# Patient Record
Sex: Male | Born: 1990 | Race: Black or African American | Hispanic: No | Marital: Single | Smoking: Never smoker
Health system: Southern US, Community
[De-identification: ages and names within clinical notes are randomized; demographics above are authoritative.]

---
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
## 2012-05-25 ENCOUNTER — Emergency Department (HOSPITAL_COMMUNITY)
Admission: EM | Admit: 2012-05-25 | Discharge: 2012-05-25 | Disposition: A | Discharge status: Home / Self Care | Payer: 59 | Source: Home / Self Care

## 2012-05-25 ENCOUNTER — Encounter (HOSPITAL_COMMUNITY): Payer: Self-pay | Admitting: Emergency Medicine

## 2012-05-25 DIAGNOSIS — A084 Viral intestinal infection, unspecified: Secondary | ICD-10-CM

## 2012-05-25 DIAGNOSIS — A088 Other specified intestinal infections: Secondary | ICD-10-CM

## 2012-05-25 MED ORDER — ONDANSETRON HCL 4 MG/2ML IJ SOLN
INTRAMUSCULAR | Status: AC
  Filled 2012-05-25: qty 2

## 2012-05-25 MED ORDER — ONDANSETRON HCL 4 MG/2ML IJ SOLN
4.0000 mg | Freq: Once | INTRAMUSCULAR | Status: AC
  Administered 2012-05-25: 4 mg via INTRAMUSCULAR

## 2012-05-25 MED ORDER — ONDANSETRON HCL 4 MG PO TABS: 4.0000 mg | ORAL_TABLET | Freq: Four times a day (QID) | ORAL | Status: DC

## 2012-05-25 MED ORDER — HYOSCYAMINE SULFATE 0.125 MG SL SUBL: 0.1250 mg | SUBLINGUAL_TABLET | SUBLINGUAL | Status: DC | PRN

## 2012-05-25 NOTE — ED Provider Notes (Signed)
History     CSN: [id number]  Arrival date & time 05/25/12  1030   None     Chief Complaint  Patient presents with  . URI    (Consider location/radiation/quality/duration/timing/severity/associated sxs/prior treatment) HPI Comments: 22 year old male with a 24-36 hour history of fever, bodyaches and abdominal pain. It is associated with nausea, vomiting and diarrhea. She has also had some dizziness quickly when leaning forward. He denies any upper respiratory symptoms. No earache, cough, congestion, or runny nose. He points to the perineal umbilical area the abdomen as a source for his abdominal cramping.   History reviewed. No pertinent past medical history.  History reviewed. No pertinent past surgical history.  No family history on file.  History  Substance Use Topics  . Smoking status: Never Smoker   . Smokeless tobacco: Not on file  . Alcohol Use: Yes      Review of Systems  Constitutional: Positive for fever and activity change. Negative for diaphoresis and fatigue.  HENT: Positive for sore throat and trouble swallowing. Negative for ear pain, facial swelling, rhinorrhea, neck pain, neck stiffness and postnasal drip.   Eyes: Negative for pain, discharge and redness.  Respiratory: Negative for cough, chest tightness and shortness of breath.   Cardiovascular: Negative.   Gastrointestinal: Negative.   Genitourinary: Negative.   Musculoskeletal: Negative.   Skin: Negative.   Neurological: Positive for dizziness.  Psychiatric/Behavioral: Negative.     Allergies  Review of patient's allergies indicates no known allergies.  Home Medications   Current Outpatient Rx  Name  Route  Sig  Dispense  Refill  . HYOSCYAMINE SULFATE 0.125 MG SL SUBL   Sublingual   Place 1 tablet (0.125 mg total) under the tongue every 4 (four) hours as needed for cramping.   20 tablet   0   . ONDANSETRON HCL 4 MG PO TABS   Oral   Take 1 tablet (4 mg total) by mouth every 6 (six)  hours.   12 tablet   0     BP 121/74  Pulse 82  Temp 99 F (37.2 C) (Oral)  Resp 16  SpO2 100%  Physical Exam  Nursing note and vitals reviewed. Constitutional: He is oriented to person, place, and time. He appears well-developed and well-nourished. No distress.  HENT:  Nose: Nose normal.  Mouth/Throat: Oropharynx is clear and moist. No oropharyngeal exudate.  Eyes: Conjunctivae normal and EOM are normal.  Neck: Normal range of motion. Neck supple.  Cardiovascular: Normal rate, regular rhythm and normal heart sounds.   Pulmonary/Chest: Effort normal and breath sounds normal. No respiratory distress. He has no wheezes. He has no rales.  Abdominal: Soft. Bowel sounds are normal. He exhibits no distension and no mass. There is no rebound and no guarding.       Minor periumbilical tenderness only. No rebound or guarding. No palpable masses the  Musculoskeletal: Normal range of motion. He exhibits no edema.  Lymphadenopathy:    He has no cervical adenopathy.  Neurological: He is alert and oriented to person, place, and time.  Skin: Skin is warm and dry. No rash noted.  Psychiatric: He has a normal mood and affect.    ED Course  Procedures (including critical care time)  Labs Reviewed - No data to display No results found.   1. Viral gastroenteritis       MDM  Instructions for viral gastroenteritis. Clear liquids in small frequent amounts for the next 3 days. Gradually advance diet as tolerated. Rest fluids  would be Pedialyte and Gatorade. Zofran 4 mg IM now Zofran 4 mg tablets Q4 to 6 hours when necessary nausea and vomiting Levsin .125 mg tablets sublingual every 4 hours when necessary cramping. Is having greater then 3 diarrhea stools per day may take Imodium right ear but only take enough medicine to slow the diarrhea down. Do not stop the diarrhea as this is the way the body shed the virus. For any worsening, higher fever or unable to hold down liquids or if your  urine volume decreases and getting sicker return or go to emergency department.           Hayden Rasmussen, NP 05/25/12 1257

## 2012-05-25 NOTE — ED Provider Notes (Signed)
Medical screening examination/treatment/procedure(s) were performed by non-physician practitioner and as supervising physician I was immediately available for consultation/collaboration.  Kratos Ruscitti, M.D.   Valrie Jia C Emori Mumme, MD 05/25/12 1703 

## 2012-05-25 NOTE — ED Notes (Signed)
Pt c/o cold sx since Saturday night.  Sx incude: fever of 101 yest, body aches, abd pain, diarrhea, vomiting, light headed Unable to keep food and fluids down  He is alert w/no signs of acute distress.

## 2013-05-04 ENCOUNTER — Inpatient Hospital Stay (HOSPITAL_COMMUNITY)
Admission: AD | Admit: 2013-05-04 | Discharge: 2013-05-08 | DRG: 885 | Disposition: A | Discharge status: Home / Self Care | Payer: 59 | Source: Intra-hospital | Attending: Psychiatry | Admitting: Psychiatry

## 2013-05-04 ENCOUNTER — Emergency Department (HOSPITAL_COMMUNITY)
Admission: EM | Admit: 2013-05-04 | Discharge: 2013-05-04 | Disposition: A | Discharge status: Other Acute Inpatient Hospital | Payer: 59 | Attending: Emergency Medicine | Admitting: Emergency Medicine

## 2013-05-04 ENCOUNTER — Encounter (HOSPITAL_COMMUNITY): Payer: Self-pay | Admitting: Emergency Medicine

## 2013-05-04 ENCOUNTER — Encounter (HOSPITAL_COMMUNITY): Payer: Self-pay | Admitting: *Deleted

## 2013-05-04 DIAGNOSIS — F329 Major depressive disorder, single episode, unspecified: Secondary | ICD-10-CM

## 2013-05-04 DIAGNOSIS — F332 Major depressive disorder, recurrent severe without psychotic features: Principal | ICD-10-CM | POA: Diagnosis present

## 2013-05-04 DIAGNOSIS — R45851 Suicidal ideations: Secondary | ICD-10-CM

## 2013-05-04 DIAGNOSIS — F911 Conduct disorder, childhood-onset type: Secondary | ICD-10-CM | POA: Insufficient documentation

## 2013-05-04 DIAGNOSIS — F4323 Adjustment disorder with mixed anxiety and depressed mood: Secondary | ICD-10-CM | POA: Diagnosis present

## 2013-05-04 DIAGNOSIS — R4689 Other symptoms and signs involving appearance and behavior: Secondary | ICD-10-CM

## 2013-05-04 DIAGNOSIS — F1010 Alcohol abuse, uncomplicated: Secondary | ICD-10-CM | POA: Diagnosis present

## 2013-05-04 DIAGNOSIS — Z79899 Other long term (current) drug therapy: Secondary | ICD-10-CM

## 2013-05-04 DIAGNOSIS — F3289 Other specified depressive episodes: Secondary | ICD-10-CM | POA: Insufficient documentation

## 2013-05-04 LAB — RAPID URINE DRUG SCREEN, HOSP PERFORMED
Barbiturates: NOT DETECTED
Cocaine: NOT DETECTED
Opiates: NOT DETECTED

## 2013-05-04 LAB — COMPREHENSIVE METABOLIC PANEL
Alkaline Phosphatase: 59 U/L (ref 39–117)
BUN: 16 mg/dL (ref 6–23)
Calcium: 9.5 mg/dL (ref 8.4–10.5)
GFR calc Af Amer: >90 mL/min (ref >90)
GFR calc non Af Amer: >90 mL/min (ref >90)
Glucose, Bld: 72 mg/dL (ref 70–99)
Total Protein: 7.3 g/dL (ref 6.0–8.3)

## 2013-05-04 LAB — ETHANOL: Alcohol, Ethyl (B): <11 mg/dL (ref 0–11)

## 2013-05-04 LAB — CBC
HCT: 43.1 % (ref 39.0–52.0)
Hemoglobin: 14 g/dL (ref 13.0–17.0)
MCH: 25.2 pg — ABNORMAL LOW (ref 26.0–34.0)
MCHC: 32.5 g/dL (ref 30.0–36.0)
MCV: 77.7 fL — ABNORMAL LOW (ref 78.0–100.0)

## 2013-05-04 MED ORDER — IBUPROFEN 200 MG PO TABS: 600.0000 mg | ORAL_TABLET | Freq: Three times a day (TID) | ORAL | Status: DC | PRN

## 2013-05-04 MED ORDER — ZOLPIDEM TARTRATE 5 MG PO TABS: 5.0000 mg | ORAL_TABLET | Freq: Every evening | ORAL | Status: DC | PRN

## 2013-05-04 MED ORDER — TRAZODONE HCL 50 MG PO TABS: 50.0000 mg | ORAL_TABLET | Freq: Every evening | ORAL | Status: DC | PRN

## 2013-05-04 MED ORDER — HYDROXYZINE HCL 25 MG PO TABS: 25.0000 mg | ORAL_TABLET | Freq: Four times a day (QID) | ORAL | Status: DC | PRN

## 2013-05-04 MED ORDER — FLUOXETINE HCL 20 MG PO CAPS
20.0000 mg | ORAL_CAPSULE | Freq: Every day | ORAL | Status: DC
  Administered 2013-05-05 – 2013-05-08 (×4): 20 mg via ORAL
  Filled 2013-05-04 (×7): qty 1

## 2013-05-04 MED ORDER — ONDANSETRON HCL 4 MG PO TABS: 4.0000 mg | ORAL_TABLET | Freq: Three times a day (TID) | ORAL | Status: DC | PRN

## 2013-05-04 MED ORDER — NICOTINE 21 MG/24HR TD PT24: 21.0000 mg | MEDICATED_PATCH | Freq: Every day | TRANSDERMAL | Status: DC

## 2013-05-04 MED ORDER — LORAZEPAM 1 MG PO TABS: 1.0000 mg | ORAL_TABLET | Freq: Three times a day (TID) | ORAL | Status: DC | PRN

## 2013-05-04 MED ORDER — TRAZODONE HCL 50 MG PO TABS
50.0000 mg | ORAL_TABLET | Freq: Every evening | ORAL | Status: DC | PRN
  Filled 2013-05-04: qty 1

## 2013-05-04 MED ORDER — ALUM & MAG HYDROXIDE-SIMETH 200-200-20 MG/5ML PO SUSP: 30.0000 mL | ORAL | Status: DC | PRN

## 2013-05-04 MED ORDER — MAGNESIUM HYDROXIDE 400 MG/5ML PO SUSP: 30.0000 mL | Freq: Every day | ORAL | Status: DC | PRN

## 2013-05-04 MED ORDER — INFLUENZA VAC SPLIT QUAD 0.5 ML IM SUSP
0.5000 mL | INTRAMUSCULAR | Status: AC
  Administered 2013-05-06: 0.5 mL via INTRAMUSCULAR
  Filled 2013-05-04: qty 0.5

## 2013-05-04 MED ORDER — ACETAMINOPHEN 325 MG PO TABS: 650.0000 mg | ORAL_TABLET | ORAL | Status: DC | PRN

## 2013-05-04 MED ORDER — IBUPROFEN 600 MG PO TABS: 600.0000 mg | ORAL_TABLET | Freq: Three times a day (TID) | ORAL | Status: DC | PRN

## 2013-05-04 MED ORDER — NICOTINE 21 MG/24HR TD PT24
21.0000 mg | MEDICATED_PATCH | Freq: Every day | TRANSDERMAL | Status: DC
  Filled 2013-05-04: qty 1

## 2013-05-04 MED ORDER — FLUOXETINE HCL 20 MG PO CAPS
20.0000 mg | ORAL_CAPSULE | Freq: Every day | ORAL | Status: DC
  Administered 2013-05-04: 20 mg via ORAL
  Filled 2013-05-04: qty 1

## 2013-05-04 NOTE — ED Notes (Signed)
Pt. Mom is taking his belongings with her. Nurse was notified

## 2013-05-04 NOTE — ED Notes (Signed)
Pt calm and cooperative at this time.  Pt deneis SI.  Pt reports "i am afraid that if i dont get some help, I may hurt someone."  Pt reports hx anger since toddler that has been increasing.  Pt reports playing football during school that helped some.  Pt reports "getting into uncessisary altercations and fightingand hitting walls when angry to avoid hitting others."  Pt reports "i used to lift weights until i felt the pain and it worked for a little while, I just need something to calm me."
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
## 2013-05-04 NOTE — ED Notes (Signed)
No acute distress noted.

## 2013-05-04 NOTE — Progress Notes (Signed)
   CARE MANAGEMENT ED NOTE 05/04/2013  Patient:  Dustin Chapman, Dustin Chapman   Account Number:  [id number]  Date Initiated:  05/04/2013  Documentation initiated by:  Edd Arbour  Subjective/Objective Assessment:   22 yr old male [facility] without pcp listed Pt confirms he does not have a pcp and reports his mother has his insurance card with his wallet     Subjective/Objective Assessment Detail:     Action/Plan:   Cm spoke with the pt and discussed how to obtain in network providers   Action/Plan Detail:   Anticipated DC Date:       Status Recommendation to Physician:   Result of Recommendation:    Other ED Services  Consult Working Plan    DC Planning Services  Other  PCP issues  Outpatient Services - Pt will follow up    Choice offered to / List presented to:            Status of service:  Completed, signed off  ED Comments:   ED Comments Detail:  [facility] ED CM spoke with pt on how to obtain an in network pcp with insurance coverage via the customer service number or web site Cm reviewed ED level of care for crisis/emergent services and community pcp level of care to manage continuous or chronic medical concerns.  The pt voiced understanding CM encouraged pt and discussed pt's responsibility to verify with pt's insurance carrier that any recommended medical provider offered by any emergency room or a hospital provider is within the carrier's network. The pt voiced understanding

## 2013-05-04 NOTE — ED Notes (Signed)
Pt coming from home with c/o medical clearance. Pt sts he feels very depressed (last 3 months). Pt sts he recently moved out of his mom's house and  is still dealing with living independently and paying bills. Pt sts he is experiencing anger issues and is afraid that he might seriously hurt someone during those episodes. Pt denies suicidal ideations, however reports hitting himself when extremely mad.

## 2013-05-04 NOTE — Progress Notes (Signed)
Writer consulted with the NP Shuvon regarding the patient meeting criteria for inpatient hospitalization. Per Minerva Areola, [facility] the patient has been accepted to [facility] Bed 502-2.   Writer informed the ER MD and the nurse. Support paperwork has been faxed to [facility].
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
## 2013-05-04 NOTE — Tx Team (Signed)
Initial Interdisciplinary Treatment Plan  PATIENT STRENGTHS: (choose at least two) Ability for insight Average or above average intelligence Capable of independent living General fund of knowledge Physical Health Supportive family/friends Work skills  PATIENT STRESSORS: Loss of grandparent/great grandparents/anger   PROBLEM LIST: Problem List/Patient Goals Date to be addressed Date deferred Reason deferred Estimated date of resolution  anger 05/04/13     Self harm behavior 05/04/13                                                DISCHARGE CRITERIA:  Ability to meet basic life and health needs Improved stabilization in mood, thinking, and/or behavior Motivation to continue treatment in a less acute level of care Need for constant or close observation no longer present Reduction of life-threatening or endangering symptoms to within safe limits Safe-care adequate arrangements made Verbal commitment to aftercare and medication compliance  PRELIMINARY DISCHARGE PLAN: Outpatient therapy Return to previous living arrangement  PATIENT/FAMIILY INVOLVEMENT: This treatment plan has been presented to and reviewed with the patient, Dustin Chapman, and/or family member,   The patient and family have been given the opportunity to ask questions and make suggestions.  Beatrix Shipper 05/04/2013, 9:24 PM

## 2013-05-05 ENCOUNTER — Encounter (HOSPITAL_COMMUNITY): Payer: Self-pay | Admitting: Family

## 2013-05-05 NOTE — [facility] Group Notes (Signed)
Adult Psychoeducational Group Note  Date:  05/05/2013 Time:  9:49 PM  Group Topic/Focus:  Wrap-Up Group:   The focus of this group is to help patients review their daily goal of treatment and discuss progress on daily workbooks.  Participation Level:  None  Participation Quality:  Appropriate  Affect:  Appropriate  Cognitive:  Appropriate  Insight: None  Engagement in Group:  None  Modes of Intervention:  Discussion  Additional Comments:  Patient did not participate in group.  Delia Chimes 05/05/2013, 9:49 PM
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
## 2013-05-05 NOTE — Tx Team (Signed)
Interdisciplinary Treatment Plan Update (Adult)  Date: 05/05/2013  Time Reviewed:  9:45 AM  Progress in Treatment: Attending groups: Yes Participating in groups:  Yes Taking medication as prescribed:  Yes Tolerating medication:  Yes Family/Significant othe contact made: CSW assessing  Patient understands diagnosis:  Yes Discussing patient identified problems/goals with staff:  Yes Medical problems stabilized or resolved:  Yes Denies suicidal/homicidal ideation: Yes Issues/concerns per patient self-inventory:  Yes Other:  New problem(s) identified: N/A  Discharge Plan or Barriers: CSW assessing for appropriate referrals.  Reason for Continuation of Hospitalization: Anxiety Depression Medication Stabilization  Comments: N/A  Estimated length of stay: 3-5 days  For review of initial/current patient goals, please see plan of care.  Attendees: Patient:    Family:     Physician:    Nursing:      Clinical Social Worker:  Dustin Ivan, LCSW 05/05/2013 10:32 AM   Other: Nanine Means, NP 05/05/2013 10:32 AM   Other:  Elizbeth Squires, care coordination 05/05/2013 10:32 AM   Other:  Juline Patch, LCSW 05/05/2013 10:32 AM   Other:  Lowella Grip, RN 05/05/2013 10:33 AM   Other:  Burnetta Sabin, RN 05/05/2013 10:33 AM   Other:  Valentina Shaggy, RN 05/05/2013 10:33 AM   Other: Claudette Head, NP 05/05/2013 10:34 AM   Other:    Other:      Scribe for Treatment Team:   Carmina Miller, 05/05/2013 , 10:32 AM

## 2013-05-05 NOTE — Progress Notes (Signed)
Adult Psychoeducational Group Note  Date:  05/05/2013 Time:  11:00am  Group Topic/Focus:  Personal Choices and Values:   The focus of this group is to help patients assess and explore the importance of values in their lives, how their values affect their decisions, how they express their values and what opposes their expression.  Participation Level:  Active  Participation Quality:  Appropriate and Attentive  Affect:  Appropriate  Cognitive:  Alert and Appropriate  Insight: Appropriate  Engagement in Group:  Engaged  Modes of Intervention:  Discussion and Education  Additional Comments:  Pt attended and participated in group. Discussion was on New Year's Resolution and the positive change you want to make in your personal development. 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  Shelly Bombard D 05/05/2013, 1:35 PM
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
## 2013-05-05 NOTE — BHH Group Notes (Signed)
[facility] LCSW Aftercare Discharge Planning Group Note   05/05/2013 8:45 AM  Participation Quality:  Alert, Appropriate and Oriented  Mood/Affect:  Flat and Depressed  Depression Rating:  2  Anxiety Rating:  1  Thoughts of Suicide:  Pt denies SI/HI  Will you contract for safety?   Yes  Current AVH:  Pt denies  Plan for Discharge/Comments:  Pt attended discharge planning group and actively participated in group.  CSW provided pt with today's workbook.  Pt reports coming to the hospital for depression and anger issues.  Pt states that he lives in [place] with his girlfriend and can return home there.  Pt is open to referrals.  CSW will assess for appropriate referrals.  No further needs voiced by pt at this time.    Transportation Means: Pt reports access to transportation - mother will pick pt up  Supports: No supports mentioned at this time  [name], LCSW 05/05/2013 10:23 AM

## 2013-05-05 NOTE — Progress Notes (Signed)
D: Patient denies SI/HI and A/V hallucinations; patient reports sleep is well; reports appetite is good ; reports energy level is normal ; reports ability to pay attention is good; rates depression as 2/10; rates hopelessness 1/10  A: Monitored q 15 minutes; patient encouraged to attend groups; patient educated about medications; patient given medications per physician orders; patient encouraged to express feelings and/or concerns  R: Patient is very quiet and minimal; patient will engage and wants to seek help with his anger; patient is appropriate to circumstances;  patient was able to set goal to talk with staff 1:1 when having feelings of SI; patient is taking medications as prescribed and tolerating medications; patient is attending all groups and engaging
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
## 2013-05-05 NOTE — Clinical Social Work Note (Signed)
Pt requested CSW to contact his employer, [employer], to inform them that pt is in the hospital.  CSW received consent from pt to make contact.  CSW spoke with Sel at [phone] and informed them that pt is in the hospital.   [name], LCSW 05/05/2013  10:47 AM
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
## 2013-05-06 DIAGNOSIS — F332 Major depressive disorder, recurrent severe without psychotic features: Principal | ICD-10-CM

## 2013-05-06 NOTE — Progress Notes (Signed)
Patient ID: Dustin Chapman, male   DOB: 1991-01-04, 23 y.o.   MRN: [id number]  D: Patient presents with depressed mood and affect. Pt. Denies SI/HI and A/V Hallucinations. Patient does not report any pain or discomfort at this time. Patient reports that he slept well and his appetite is good. Patient rates his depression and hopelessness at 1/10 today.  A: Support and encouragement provided to the patient. Scheduled medications administered to patient per physician's orders. Writer administered flu vaccination to patient. No adverse effects reported or observed.   R: Patient is receptive and cooperative but minimal with Clinical research associatewriter and forwards very little. Q15 minute checks are maintained for safety.

## 2013-05-06 NOTE — Progress Notes (Signed)
Patient ID: Dustin Chapman, male   DOB: [birth year], 23 y.o.   MRN: [id number]  Morning Wellness Group 9:00 AM  The focus of this group is to educate the patient on the purpose and policies of crisis stabilization and provide a format to answer questions about their admission.  The group details unit policies and expectations of patients while admitted.  Patient attended group and was attentive but laughed inappropriately at times with another patient. Patient did not speak but paid attention when Clinical research associatewriter and other RN were speaking. No questions or concerns were voiced by patient.

## 2013-05-06 NOTE — Progress Notes (Signed)
Pt attended Karaoke group and participated by singing a song.  

## 2013-05-06 NOTE — Progress Notes (Signed)
[facility]  05/06/2013 12:33 PM Dustin Chapman  MRN:  [id number] Subjective:  Patient was seen and chart reviewed. Patient was admitted with major depressive disorder and self-injurious behaviors and significant irritability. Patient has been compliant with his medication management pending patient's psychiatric hospitalization without significant difficulties. Patient hasn't reported no adverse affect of the medication. Reportedly he has been living with his girlfriend and has no children. He was a Company secretarywarehouse worker. He has no history of alcohol and drug abuse  Diagnosis:   DSM5: Schizophrenia Disorders:   Obsessive-Compulsive Disorders:   Trauma-Stressor Disorders:   Substance/Addictive Disorders:   Depressive Disorders:  Major Depressive Disorder - Severe (296.23)  Axis I: Major Depression, Recurrent severe  ADL's:  Impaired  Sleep: Poor  Appetite:  Poor  Suicidal Ideation:  Patient contracts for safety while in the hospital Homicidal Ideation:  Patient endorses being angry with the people who is trying to bother him.  AEB (as evidenced by):  Psychiatric Specialty Exam: ROS  Blood pressure 139/83, pulse 78, temperature 97.1 F (36.2 C), temperature source Oral, resp. rate 17, height 5\' 9"  (1.753 m), weight 138.347 kg (305 lb).Body mass index is 45.02 kg/(m^2).  General Appearance: Disheveled and Guarded  Eye Contact::  Minimal  Speech:  Clear and Coherent  Volume:  Decreased  Mood:  Angry, Anxious, Depressed and Irritable  Affect:  Depressed and Flat  Thought Process:  Goal Directed and Intact  Orientation:  Full (Time, Place, and Person)  Thought Content:  Rumination  Suicidal Thoughts:  Yes.  without intent/plan  Homicidal Thoughts:  Yes.  without intent/plan  Memory:  Immediate;   Fair  Judgement:  Impaired  Insight:  Lacking  Psychomotor Activity:  Psychomotor Retardation  Concentration:  Fair  Recall:  Fair  Akathisia:  No  Handed:  Right  AIMS (if  indicated):     Assets:  Communication Skills Desire for Improvement Financial Resources/Insurance Housing Physical Health Resilience Social Support  Sleep:  Number of Hours: 6   Current Medications: Current Facility-Administered Medications  Medication Dose Route Frequency Provider Last Rate Last Dose  . acetaminophen (TYLENOL) tablet 650 mg  650 mg Oral Q4H PRN Shuvon Rankin, NP      . alum & mag hydroxide-simeth (MAALOX/MYLANTA) 200-200-20 MG/5ML suspension 30 mL  30 mL Oral PRN Shuvon Rankin, NP      . FLUoxetine (PROZAC) capsule 20 mg  20 mg Oral Daily Shuvon Rankin, NP   20 mg at 05/06/13 0845  . hydrOXYzine (ATARAX/VISTARIL) tablet 25 mg  25 mg Oral Q6H PRN Shuvon Rankin, NP      . ibuprofen (ADVIL,MOTRIN) tablet 600 mg  600 mg Oral Q8H PRN Shuvon Rankin, NP      . magnesium hydroxide (MILK OF MAGNESIA) suspension 30 mL  30 mL Oral Daily PRN Shuvon Rankin, NP      . ondansetron (ZOFRAN) tablet 4 mg  4 mg Oral Q8H PRN Shuvon Rankin, NP      . traZODone (DESYREL) tablet 50 mg  50 mg Oral QHS PRN Shuvon Rankin, NP        Lab Results:  Results for orders placed during the hospital encounter of 05/04/13 (from the past 48 hour(s))  URINE RAPID DRUG SCREEN (HOSP PERFORMED)     Status: None   Collection Time    05/04/13  3:47 PM      Result Value Range   Opiates NONE DETECTED  NONE DETECTED   Cocaine NONE DETECTED  NONE DETECTED  Benzodiazepines NONE DETECTED  NONE DETECTED   Amphetamines NONE DETECTED  NONE DETECTED   Tetrahydrocannabinol NONE DETECTED  NONE DETECTED   Barbiturates NONE DETECTED  NONE DETECTED   Comment:            DRUG SCREEN FOR MEDICAL PURPOSES     ONLY.  IF CONFIRMATION IS NEEDED     FOR ANY PURPOSE, NOTIFY LAB     WITHIN 5 DAYS.                LOWEST DETECTABLE LIMITS     FOR URINE DRUG SCREEN     Drug Class       Cutoff (ng/mL)     Amphetamine      1000     Barbiturate      200     Benzodiazepine   200     Tricyclics       300     Opiates           300     Cocaine          300     THC              50    Physical Findings: AIMS: Facial and Oral Movements Muscles of Facial Expression: None, normal Lips and Perioral Area: None, normal Jaw: None, normal Tongue: None, normal,Extremity Movements Upper (arms, wrists, hands, fingers): None, normal Lower (legs, knees, ankles, toes): None, normal, Trunk Movements Neck, shoulders, hips: None, normal, Overall Severity Severity of abnormal movements (highest score from questions above): None, normal Incapacitation due to abnormal movements: None, normal Patient's awareness of abnormal movements (rate only patient's report): No Awareness, Dental Status Current problems with teeth and/or dentures?: No Does patient usually wear dentures?: No  CIWA:    COWS:     Treatment Plan Summary: Daily contact with patient to assess and evaluate symptoms and progress in treatment Medication management  Plan: Treatment Plan/Recommendations:   1. Admit for crisis management and stabilization. 2. Medication management to reduce current symptoms to base line and improve the patient's overall level of functioning. 3. Treat health problems as indicated. 4. Develop treatment plan to decrease risk of relapse upon discharge and to reduce the need for readmission. 5. Psycho-social education regarding relapse prevention and self care. 6. Health care follow up as needed for medical problems. 7. Restart home medications where appropriate.   Medical Decision Making Problem Points:  Established problem, worsening (2), Review of last therapy session (1) and Review of psycho-social stressors (1) Data Points:  Review or order clinical lab tests (1) Review or order medicine tests (1) Review of medication regiment & side effects (2) Review of new medications or change in dosage (2)  I certify that inpatient services furnished can reasonably be expected to improve the patient's condition.    Tedrick Port,JANARDHAHA R. 05/06/2013, 12:33 PM

## 2013-05-07 NOTE — Progress Notes (Signed)
D: Pt is appropriate in affect and mood. Pt still verbalizes some ongoing depression but not an decreasing level. He verbalizes to this Clinical research associatewriter that he wants his treatment to focus on therapy rather on medication. He wants to keep his medication at a low.  Pt was pleasant in conversation and was able to talk about his future goals. 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 This pt has been observed with active participation within the milieu. No prn's requested or needed. A: Continued support and availability as needed was extended to this pt. Staff continue to monitor pt with q7415min checks.  R:  Pt receptive to treatment. Pt remains safe at this time.

## 2013-05-07 NOTE — [facility] Group Notes (Signed)
[facility] LCSW Group Therapy  Feelings Around Relapse 1:15 -2:30        05/07/2013  3:40 PM   Type of Therapy:  Group Therapy  Participation Level:  Appropriate  Participation Quality:  Appropriate  Affect:  Appropriate  Cognitive:  Attentive Appropriate  Insight:  Developing/Improving  Engagement in Therapy: Developing/Improving  Modes of Intervention:  Discussion Exploration Problem-Solving Supportive  Summary of Progress/Problems:  The topic for today was feelings around relapse.    Patient processed feelings toward relapse and was able to relate to peers. He shared relapse for him is becoming depressed and angry.  He talked about how he sometimes act out violent.  Group explored possible consequences of violence including going to jail.   Patient identified coping skills that can be used to prevent a relapse.  Writer advised patient of anger management group through [facility].  Patient asked for and received a brochure on their services.   Wynn BankerHodnett, Jillianne Gamino Hairston 05/07/2013 3:40 PM
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
## 2013-05-07 NOTE — Progress Notes (Signed)
(  Discharging tomorrow, Saturday) [facility] :  Will you be returning to the same living situation after discharge: Yes,  returning home At discharge, do you have transportation home?:Yes,  mom will pick pt up Do you have the ability to pay for your medications:Yes,  access to meds  Release of information consent forms completed and in the chart;  Patient's signature needed at discharge.  Patient to Follow up at: Follow-up Information   [facility] On [facility]/18/2013. (Appointment scheduled at 12:30 pm with Forde RadonLeanne Yates for therapy.  Arrive at 12:00 pm with completed paperwork!)    Contact information:   [address] [place], [place] [address] Phone: [phone]      [facility] 06[facility]. (Appointment scheduled at 1:15 pm for medication management)    Contact information:   [address] [place], [place] [address] Phone: [phone]      Patient denies SI/HI:   Yes,  denies SI/HI    Safety Planning and Suicide Prevention discussed:  Yes,  discussed with pt and pt's mother.  See suicide prevention education note.   Carmina MillerHorton, Terence Googe Nicole 05/07/2013, 3:02 PM

## 2013-05-07 NOTE — [facility] Group Notes (Signed)
[facility]   05/07/2013 12:56 PM    Participation Quality:  Appropraite  Mood/Affect:  Appropriate  Depression Rating:  1  Anxiety Rating:  1  Thoughts of Suicide:  No  Will you contract for safety?   NA  Current AVH:  No  Plan for Discharge/Comments:  Patient attended discharge planning group and actively participated in group. He reported doing better and hoping to discharge today.  He advised he and his CSW have follow up plans in place.  CSW provided all participants with daily workbook.   Transportation Means: Patient has transportation.   Supports:  Patient has a support system.   Selassie Spatafore, Joesph JulyQuylle Hairston

## 2013-05-07 NOTE — [facility] Suicide Risk Assessment (Signed)
[facility] INPATIENT:  Family/Significant Other Suicide Prevention Education  Suicide Prevention Education:  Education Completed; Dustin SirenDonna Chapman - mother [phone]),  (name of family member/significant other) has been identified by the patient as the family member/significant other with whom the patient will be residing, and identified as the person(s) who will aid the patient in the event of a mental health crisis (suicidal ideations/suicide attempt).  With written consent from the patient, the family member/significant other has been provided the following suicide prevention education, prior to the and/or following the discharge of the patient.  The suicide prevention education provided includes the following:  Suicide risk factors  Suicide prevention and interventions  National Suicide Hotline telephone number  [facility] assessment telephone number  [facility] Emergency Assistance 911  [facility] and/or Residential Mobile Crisis Unit telephone number  Request made of family/significant other to:  Remove weapons (e.g., guns, rifles, knives), all items previously/currently identified as safety concern.    Remove drugs/medications (over-the-counter, prescriptions, illicit drugs), all items previously/currently identified as a safety concern.  The family member/significant other verbalizes understanding of the suicide prevention education information provided.  The family member/significant other agrees to remove the items of safety concern listed above.  Dustin Chapman, Dustin Chapman 05/07/2013, 11:53 AM

## 2013-05-07 NOTE — Progress Notes (Signed)
[facility] Group Notes:  (Nursing/MHT/Case Management/Adjunct)  Date:  05/07/2013  Time:  11:59 PM  Type of Therapy:  Group Therapy  Participation Level:  Active  Participation Quality:  Appropriate  Affect:  Appropriate  Cognitive:  Appropriate  Insight:  Good  Engagement in Group:  Engaged  Modes of Intervention:  Socialization and Support  Summary of Progress/Problems: Pt. Participated in group.  Pt. Stated he plans to use coping skills and out patient therapy for relapse prevention.  Sondra ComeWilson, Kassim Guertin J 05/07/2013, 11:59 PM

## 2013-05-07 NOTE — Tx Team (Signed)
Interdisciplinary Treatment Plan Update (Adult)  Date: 05/07/2013  Time Reviewed:  9:45 AM  Progress in Treatment: Attending groups: Yes Participating in groups:  Yes Taking medication as prescribed:  Yes Tolerating medication:  Yes Family/Significant othe contact made: CSW will make attempts today Patient understands diagnosis:  Yes Discussing patient identified problems/goals with staff:  Yes Medical problems stabilized or resolved:  Yes Denies suicidal/homicidal ideation: Yes Issues/concerns per patient self-inventory:  Yes Other:  New problem(s) identified: N/A  Discharge Plan or Barriers: Pt [facility] for medication management and therapy.    Reason for Continuation of Hospitalization: Anxiety Depression Medication Stabilization  Comments: N/A  Estimated length of stay: 2 days, d/c Sunday  For review of initial/current patient goals, please see plan of care.  Attendees: Patient:  Dustin Chapman  05/07/2013 10:49 AM   Family:     Physician:  Dr. Javier GlazierJohnalagadda 05/07/2013 10:46 AM   Nursing:   Waynetta SandyJan Wright, RN 05/07/2013 10:46 AM   Clinical Social Worker:  Reyes Ivanhelsea Horton, LCSW 05/07/2013 10:46 AM   Other: Claudette Headonrad Withrow, PA 05/07/2013 10:46 AM   Other:  Leighton ParodyBritney Tyson, RN 05/07/2013 10:46 AM   Other:  Juline PatchQuylle Hodnett, LCSW 05/07/2013 10:46 AM   Other:  Seabron Spatesoneica Byrd, RN 05/07/2013 10:47 AM   Other:    Other:    Other:    Other:    Other:    Other:     Scribe for Treatment Team:   Carmina MillerHorton, Dietra Stokely Nicole, 05/07/2013 10:46 AM

## 2013-05-07 NOTE — Progress Notes (Signed)
D: Patient denies SI/HI and A/V hallucinations; patient reports sleep is well; reports appetite is good; reports energy level is normal ; reports ability to pay attention is good; rates depression as 1/10; rates hopelessness 1/10; rates anxiety as 1/10; patient reports no pain or any other symptoms; patient reports that he does not want to increase his Prozac;  A: Monitored q 15 minutes; patient encouraged to attend groups; patient educated about medications; patient given medications per physician orders; patient encouraged to express feelings and/or concerns  R: Patient is appropriate to circumstances and cooperative; patient is very pleasant; patient has insight and able to voice coping skills; patient is interaction with staff and peers is appropriate; patient was able to set goal to talk with staff 1:1 when having feelings of SI; patient is taking medications as prescribed and tolerating medications; patient is attending all groups

## 2013-05-08 MED ORDER — FLUOXETINE HCL 20 MG PO CAPS: 20.0000 mg | ORAL_CAPSULE | Freq: Every day | ORAL | Status: DC

## 2013-05-08 MED ORDER — TRAZODONE HCL 50 MG PO TABS: 50.0000 mg | ORAL_TABLET | Freq: Every evening | ORAL | Status: DC | PRN

## 2013-05-08 MED ORDER — HYDROXYZINE HCL 25 MG PO TABS: 25.0000 mg | ORAL_TABLET | Freq: Four times a day (QID) | ORAL | Status: DC | PRN

## 2013-05-08 NOTE — Progress Notes (Signed)
D: Pt denies SI/HI/AVH. Pt is pleasant and cooperative. Pt  Ready to go home tomorrow. "Everythings good"  A: Pt was offered support and encouragement.  Pt was encourage to attend groups. Q 15 minute checks were done for safety.  R:Pt attends groups and interacts well with peers and staff. Pt is taking medication. Pt has no complaints at this time.Pt receptive to treatment and safety maintained on unit.

## 2013-05-08 NOTE — Discharge Instructions (Signed)
Major Depressive Disorder °Major depressive disorder (MDD) is a mental illness. It also may be called clinical depression or unipolar depression. MDD usually causes feelings of sadness, hopelessness, or helplessness. Some people with MDD do not feel particularly sad but lose interest in doing things they used to enjoy (anhedonia). MDD also can cause physical symptoms. It can interfere with work, school, relationships, and other normal everyday activities. MDD varies in severity but is longer lasting and more serious than the sadness we all feel from time to time in our lives. °MDD often is triggered by stressful life events or major life changes. Examples of these triggers include divorce, loss of your job or home, a move, and the death of a family member or close friend. Sometimes MDD occurs for no obvious reason at all. People who have family members with MDD or bipolar disorder are at higher risk for developing MDD, with or without life stressors. MDD can occur at any age. It may occur just once in your life (single episode MDD). It may occur multiple times (recurrent MDD). °SYMPTOMS °People with MDD have either anhedonia or depressed mood on nearly a daily basis for at least 2 weeks or longer. Symptoms of depressed mood include: °· Feelings of sadness (blue or down in the dumps) or emptiness. °· Feelings of hopelessness or helplessness. °· Tearfulness or episodes of crying (may be observed by others). °· Irritability (children and adolescents). °In addition to depressed mood or anhedonia or both, people with MDD have at least four of the following symptoms: °· Difficulty sleeping or sleeping too much.   °· Significant change (increase or decrease) in appetite or weight.   °· Lack of energy or motivation. °· Feelings of guilt and worthlessness.   °· Difficulty concentrating, remembering, or making decisions. °· Unusually slow movement (psychomotor retardation) or restlessness (as observed by others).    °· Recurrent wishes for death, recurrent thoughts of self-harm (suicide), or a suicide attempt. °People with MDD commonly have persistent negative thoughts about themselves, other people, and the world. People with severe MDD may experience distorted beliefs or perceptions about the world (psychotic delusions). They also may see or hear things that are not real (psychotic hallucinations). °DIAGNOSIS °MDD is diagnosed through an assessment by your caregiver. Your caregiver will ask about aspects of your daily life, such as mood, sleep, and appetite, to see if you have the diagnostic symptoms of MDD. Your caregiver may ask about your medical history and use of alcohol or drugs, including prescription medications. Your caregiver also may do a physical exam and blood work. This is because certain medical conditions and the use of certain substances can cause MDD-like symptoms (secondary depression). Your caregiver also may refer you to a mental health specialist for further evaluation and treatment. °TREATMENT °It is important to recognize the symptoms of MDD and seek treatment. The following treatments can be prescribed for MDD:   °· Medication Antidepressant medications usually are prescribed. Antidepressant medications are thought to correct chemical imbalances in the brain that are commonly associated with MDD. Other types of medication may be added if MDD symptoms do not respond to antidepressant medications alone or if psychotic delusions or hallucinations occur. °· Talk therapy Talk therapy can be helpful in treating MDD by providing support, education, and guidance. Certain types of talk therapy also can help with negative thinking (cognitive behavioral therapy) and with relationship issues that trigger MDD (interpersonal therapy). °A mental health specialist can help determine which treatment is best for you. Most people with MDD do well with a   combination of medication and talk therapy. Treatments involving  electrical stimulation of the brain can be used in situations with extremely severe symptoms or when medication and talk therapy do not work over time. These treatments include electroconvulsive therapy, transcranial magnetic stimulation, and vagal nerve stimulation. °Document Released: 08/17/2012 Document Reviewed: 08/17/2012 °Exit[employer]® Patient Information ©2014 [employer]. ° °

## 2013-05-08 NOTE — Progress Notes (Addendum)
Nrsg  D   DC MD completed dc order, and dc SRA. Pt denies audit, vis, tactile halluc. He  Completed am self inventory and on it he wrote his depression and hopeless ness were "1/1" and his dc  Plan is : "  taking advantage of my loved ones...      `  A Pt is UAL without diff.He is given DC AVS, states he understands these directions and understands and will comply with plan.   R Pt 's belongings returned to him and escorted to bldg entrance.

## 2013-05-08 NOTE — Progress Notes (Signed)
Psychoeducational Group Note  Date: 05/08/2013 Time:  1015  Group Topic/Focus:  Identifying Needs:   The focus of this group is to help patients identify their personal needs that have been historically problematic and identify healthy behaviors to address their needs.  Participation Level:  Active  Participation Quality:  Appropriate  Affect:  Appropriate  Cognitive:  Oriented  Insight:  Engaged  Engagement in Group:  Engaged  Additional Comments:  Pt participated and is engaged in the group  [name] A 

## 2013-05-08 NOTE — [facility] Suicide Risk Assessment (Signed)
Suicide Risk Assessment  Discharge Assessment     Demographic Factors:  Male and Adolescent or young adult  Mental Status Per Nursing Assessment::   On Admission:  Self-harm behaviors;Thoughts of violence towards others  Current Mental Status by Physician: In full contact with reality. Mood is euthymic, affect is appropriate. Denies suicidal ideas, plans or intent. He is looking forward to changes in terms of his employment and being back in school for the Fall 2015 semester. He is going to continue outpatient follow up   Loss Factors: NA  Historical Factors: NA  Risk Reduction Factors:   Employed and Positive social support  Continued Clinical Symptoms:  Depression:   Impulsivity  Cognitive Features That Contribute To Risk:  Polarized thinking Thought constriction (tunnel vision)    Suicide Risk:  Minimal: No identifiable suicidal ideation.  Patients presenting with no risk factors but with morbid ruminations; may be classified as minimal risk based on the severity of the depressive symptoms  Discharge Diagnoses:   AXIS I:  Major Depression, single episode AXIS II:  Deferred AXIS III:  History reviewed. No pertinent past medical history. AXIS IV:  other psychosocial or environmental problems AXIS V:  61-70 mild symptoms  Plan Of Care/Follow-up recommendations:  Activity:  as tolerated Diet:  regular [facility] Is patient on multiple antipsychotic therapies at discharge:  No   Has Patient had three or more failed trials of antipsychotic monotherapy by history:  No  Recommended Plan for Multiple Antipsychotic Therapies: NA  Dustin Chapman A 05/08/2013, 10:19 AM

## 2013-05-08 NOTE — Progress Notes (Signed)
.  Psychoeducational Group Note    Date: 05/08/2013 Time:  0930    Goal Setting Purpose of Group: To be able to set a goal that is measurable and that can be accomplished in one day Participation Level:  Active  Participation Quality:  Appropriate  Affect:  Appropriate  Cognitive:  Oriented  Insight:  Improving  Engagement in Group:  Engaged  Additional Comments:  Engaged and participated in the group  Tyiesha Brackney A 
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
## 2013-05-11 NOTE — Consult Note (Signed)
Case discussed, agree with plan 

## 2013-05-13 NOTE — Progress Notes (Signed)
Patient Discharge Instructions:  Next Level Care Provider Has Access to the EMR, 05/13/13 [facility] via [facility]/Epic access.  Jerelene ReddenSheena E [place], 05/13/2013, 2:26 PM

## 2013-05-18 ENCOUNTER — Ambulatory Visit (HOSPITAL_COMMUNITY): Payer: Self-pay | Admitting: Psychology

## 2013-06-03 ENCOUNTER — Ambulatory Visit (HOSPITAL_COMMUNITY): Payer: Self-pay | Admitting: Psychiatry

## 2013-12-19 ENCOUNTER — Emergency Department (HOSPITAL_COMMUNITY): Payer: 59

## 2013-12-19 ENCOUNTER — Emergency Department (HOSPITAL_COMMUNITY)
Admission: EM | Admit: 2013-12-19 | Discharge: 2013-12-19 | Disposition: A | Discharge status: Home / Self Care | Payer: 59 | Attending: Emergency Medicine | Admitting: Emergency Medicine

## 2013-12-19 ENCOUNTER — Encounter (HOSPITAL_COMMUNITY): Payer: Self-pay | Admitting: Emergency Medicine

## 2013-12-19 DIAGNOSIS — S060X9A Concussion with loss of consciousness of unspecified duration, initial encounter: Secondary | ICD-10-CM | POA: Diagnosis not present

## 2013-12-19 DIAGNOSIS — W1809XA Striking against other object with subsequent fall, initial encounter: Secondary | ICD-10-CM | POA: Diagnosis not present

## 2013-12-19 DIAGNOSIS — S0990XA Unspecified injury of head, initial encounter: Secondary | ICD-10-CM | POA: Diagnosis present

## 2013-12-19 DIAGNOSIS — IMO0002 Reserved for concepts with insufficient information to code with codable children: Secondary | ICD-10-CM | POA: Diagnosis not present

## 2013-12-19 DIAGNOSIS — Z23 Encounter for immunization: Secondary | ICD-10-CM | POA: Insufficient documentation

## 2013-12-19 DIAGNOSIS — Z79899 Other long term (current) drug therapy: Secondary | ICD-10-CM | POA: Diagnosis not present

## 2013-12-19 DIAGNOSIS — Y9289 Other specified places as the place of occurrence of the external cause: Secondary | ICD-10-CM | POA: Diagnosis not present

## 2013-12-19 DIAGNOSIS — Y9389 Activity, other specified: Secondary | ICD-10-CM | POA: Diagnosis not present

## 2013-12-19 MED ORDER — OXYCODONE-ACETAMINOPHEN 5-325 MG PO TABS
1.0000 | ORAL_TABLET | Freq: Once | ORAL | Status: AC
  Administered 2013-12-19: 1 via ORAL
  Filled 2013-12-19: qty 1

## 2013-12-19 MED ORDER — ONDANSETRON HCL 4 MG PO TABS: 4.0000 mg | ORAL_TABLET | Freq: Three times a day (TID) | ORAL | Status: AC | PRN

## 2013-12-19 MED ORDER — TETANUS-DIPHTH-ACELL PERTUSSIS 5-2.5-18.5 LF-MCG/0.5 IM SUSP
0.5000 mL | Freq: Once | INTRAMUSCULAR | Status: AC
  Administered 2013-12-19: 0.5 mL via INTRAMUSCULAR
  Filled 2013-12-19: qty 0.5

## 2013-12-19 MED ORDER — OXYCODONE-ACETAMINOPHEN 5-325 MG PO TABS: 1.0000 | ORAL_TABLET | Freq: Four times a day (QID) | ORAL | Status: AC | PRN

## 2013-12-19 MED ORDER — ONDANSETRON 4 MG PO TBDP
8.0000 mg | ORAL_TABLET | Freq: Once | ORAL | Status: AC
  Administered 2013-12-19: 8 mg via ORAL
  Filled 2013-12-19: qty 2

## 2013-12-19 MED ORDER — ONDANSETRON 4 MG PO TBDP
4.0000 mg | ORAL_TABLET | Freq: Once | ORAL | Status: AC
  Administered 2013-12-19: 4 mg via ORAL
  Filled 2013-12-19: qty 1

## 2013-12-19 NOTE — ED Notes (Signed)
Patient transported to CT 

## 2013-12-19 NOTE — ED Notes (Signed)
Patient states he was rushing to go to work and fell and hit his head.  Abrasion noted to the top of his forehead.  Patient having difficulty staying awake

## 2013-12-19 NOTE — ED Provider Notes (Signed)
CSN: [id number]     Arrival date & time 12/19/13  0631 History   First MD Initiated Contact with Patient 12/19/13 [phone]     Chief Complaint  Patient presents with  . Head Injury     (Consider location/radiation/quality/duration/timing/severity/associated sxs/prior Treatment) HPI  This is a 23 year old male who presents following a fall. Patient reports at approximately 5:30 this morning he fell down a flight of steps after tripping. He is unsure whether he lost consciousness. He reports photosensitivity as well as sensitivity to sounds. Denies any weakness, numbness, tingling. Has full recollection of events. No vomiting noted.  He is reporting a headache. Denies any other injury. He is not on aspirin, Plavix, or Coumadin.  History reviewed. No pertinent past medical history. History reviewed. No pertinent past surgical history. History reviewed. No pertinent family history. History  Substance Use Topics  . Smoking status: Never Smoker   . Smokeless tobacco: Not on file  . Alcohol Use: Yes     Comment: ocasionaly    Review of Systems  Constitutional: Negative.  Negative for fever.  Eyes: Positive for photophobia.  Respiratory: Negative.  Negative for chest tightness and shortness of breath.   Cardiovascular: Negative.  Negative for chest pain.  Gastrointestinal: Negative.  Negative for abdominal pain.  Genitourinary: Negative.   Musculoskeletal: Negative for back pain and neck pain.  Skin: Positive for wound.  Neurological: Positive for light-headedness and headaches. Negative for dizziness, weakness and numbness.  All other systems reviewed and are negative.     Allergies  Review of patient's allergies indicates no known allergies.  Home Medications   Prior to Admission medications   Medication Sig Start Date End Date Taking? Authorizing Provider  acetaminophen (TYLENOL) 500 MG tablet Take 500 mg by mouth every 6 (six) hours as needed (headache).   Yes Historical  Provider, MD  FLUoxetine (PROZAC) 20 MG capsule Take 20 mg by mouth daily.   Yes Historical Provider, MD  hydrOXYzine (ATARAX/VISTARIL) 25 MG tablet Take 25 mg by mouth every 6 (six) hours as needed for anxiety.   Yes Historical Provider, MD  traZODone (DESYREL) 50 MG tablet Take 50 mg by mouth at bedtime.   Yes Historical Provider, MD  ondansetron (ZOFRAN) 4 MG tablet Take 1 tablet (4 mg total) by mouth every 8 (eight) hours as needed for nausea or vomiting. 12/19/13   Shon Baton, MD  oxyCODONE-acetaminophen (PERCOCET/ROXICET) 5-325 MG per tablet Take 1-2 tablets by mouth every 6 (six) hours as needed for moderate pain or severe pain. 12/19/13   Shon Baton, MD   BP 118/54  Pulse 53  Temp(Src) 98.5 F (36.9 C) (Oral)  Resp 16  Ht 5\' 9"  (1.753 m)  Wt 315 lb (142.883 kg)  BMI 46.50 kg/m2  SpO2 100% Physical Exam  Nursing note and vitals reviewed. Constitutional: He is oriented to person, place, and time. He appears well-developed and well-nourished. No distress.  Overweight  HENT:  Head: Normocephalic.  Right Ear: External ear normal.  Left Ear: External ear normal.  Mouth/Throat: Oropharynx is clear and moist.  Hematoma with abrasion noted over the right side of the forehead just at the scalp line  Eyes: EOM are normal. Pupils are equal, round, and reactive to light.  Neck: Normal range of motion. Neck supple.  No midline C-spine tenderness  Cardiovascular: Normal rate, regular rhythm and normal heart sounds.   No murmur heard. Pulmonary/Chest: Effort normal and breath sounds normal. No respiratory distress. He has no wheezes.  Abdominal: Soft. Bowel sounds are normal. There is no tenderness. There is no rebound.  Musculoskeletal: He exhibits no edema.  No midline spinal tenderness, step-off, or deformity  Lymphadenopathy:    He has no cervical adenopathy.  Neurological: He is alert and oriented to person, place, and time.  5 out of 5 strength in all 4 extremities   Skin: Skin is warm and dry.  Psychiatric: He has a normal mood and affect.    ED Course  Procedures (including critical care time) Labs Review Labs Reviewed - No data to display  Imaging Review Ct Head Wo Contrast  12/19/2013   CLINICAL DATA:  Headache  EXAM: CT HEAD WITHOUT CONTRAST  TECHNIQUE: Contiguous axial images were obtained from the base of the skull through the vertex without intravenous contrast.  COMPARISON:  None.  FINDINGS: Brain parenchyma, ventricular system, and extra-axial space are within normal limits. No mass effect, midline shift, or acute hemorrhage. Mastoid air cells clear. Small mucous retention cysts in the maxillary sinuses.  IMPRESSION: No acute intracranial pathology.   Electronically Signed   By: Maryclare BeanArt  Hoss M.D.   On: 12/19/2013 08:33     EKG Interpretation None      MDM   Final diagnoses:  Concussion, with loss of consciousness of unspecified duration, initial encounter   Patient presents following a fall down a flight of steps. Notable abrasions the 4 head. He endorses a headache, photosensitivity, and sounds sensitivity. Nonfocal on exam. Otherwise well-appearing. No other obvious signs of trauma.  C-spine was cleared by Nexus criteria. Per [place] CT head rules, CT was obtained given a fall down a flight of steps. Patient was given pain and nausea medication. CT scan is negative. Patient continuing to endorse persistent headache. We'll redress pain and nausea medication. Patient has been able to ambulate and tolerate fluids. We'll discharge home. Patient was given concussion precautions. Discussed with the patient minimizing stimulus, resting. He is not to engage in any strenuous activity. He does not her primary care Dr. Andrey CotaStates that his dad knows someone he can followup with.  After history, exam, and medical workup I feel the patient has been appropriately medically screened and is safe for discharge home. Pertinent diagnoses were discussed with the  patient. Patient was given return precautions.    Shon Batonourtney F Nithila Sumners, MD 12/19/13 [phone]

## 2013-12-19 NOTE — Discharge Instructions (Signed)
Concussion  A concussion, or closed-head injury, is a brain injury caused by a direct blow to the head or by a quick and sudden movement (jolt) of the head or neck. Concussions are usually not life-threatening. Even so, the effects of a concussion can be serious. If you have had a concussion before, you are more likely to experience concussion-like symptoms after a direct blow to the head.   CAUSES  · Direct blow to the head, such as from running into another player during a soccer game, being hit in a fight, or hitting your head on a hard surface.  · A jolt of the head or neck that causes the brain to move back and forth inside the skull, such as in a car crash.  SIGNS AND SYMPTOMS  The signs of a concussion can be hard to notice. Early on, they may be missed by you, family members, and health care providers. You may look fine but act or feel differently.  Symptoms are usually temporary, but they may last for days, weeks, or even longer. Some symptoms may appear right away while others may not show up for hours or days. Every head injury is different. Symptoms include:  · Mild to moderate headaches that will not go away.  · A feeling of pressure inside your head.  · Having more trouble than usual:  ¨ Learning or remembering things you have heard.  ¨ Answering questions.  ¨ Paying attention or concentrating.  ¨ Organizing daily tasks.  ¨ Making decisions and solving problems.  · Slowness in thinking, acting or reacting, speaking, or reading.  · Getting lost or being easily confused.  · Feeling tired all the time or lacking energy (fatigued).  · Feeling drowsy.  · Sleep disturbances.  ¨ Sleeping more than usual.  ¨ Sleeping less than usual.  ¨ Trouble falling asleep.  ¨ Trouble sleeping (insomnia).  · Loss of balance or feeling lightheaded or dizzy.  · Nausea or vomiting.  · Numbness or tingling.  · Increased sensitivity to:  ¨ Sounds.  ¨ Lights.  ¨ Distractions.  · Vision problems or eyes that tire  easily.  · Diminished sense of taste or smell.  · Ringing in the ears.  · Mood changes such as feeling sad or anxious.  · Becoming easily irritated or angry for little or no reason.  · Lack of motivation.  · Seeing or hearing things other people do not see or hear (hallucinations).  DIAGNOSIS  Your health care provider can usually diagnose a concussion based on a description of your injury and symptoms. He or she will ask whether you passed out (lost consciousness) and whether you are having trouble remembering events that happened right before and during your injury.  Your evaluation might include:  · A brain scan to look for signs of injury to the brain. Even if the test shows no injury, you may still have a concussion.  · Blood tests to be sure other problems are not present.  TREATMENT  · Concussions are usually treated in an emergency department, in urgent care, or at a clinic. You may need to stay in the hospital overnight for further treatment.  · Tell your health care provider if you are taking any medicines, including prescription medicines, over-the-counter medicines, and natural remedies. Some medicines, such as blood thinners (anticoagulants) and aspirin, may increase the chance of complications. Also tell your health care provider whether you have had alcohol or are taking illegal drugs. This information   may affect treatment.  · Your health care provider will send you home with important instructions to follow.  · How fast you will recover from a concussion depends on many factors. These factors include how severe your concussion is, what part of your brain was injured, your age, and how healthy you were before the concussion.  · Most people with mild injuries recover fully. Recovery can take time. In general, recovery is slower in older persons. Also, persons who have had a concussion in the past or have other medical problems may find that it takes longer to recover from their current injury.  HOME  CARE INSTRUCTIONS  General Instructions  · Carefully follow the directions your health care provider gave you.  · Only take over-the-counter or prescription medicines for pain, discomfort, or fever as directed by your health care provider.  · Take only those medicines that your health care provider has approved.  · Do not drink alcohol until your health care provider says you are well enough to do so. Alcohol and certain other drugs may slow your recovery and can put you at risk of further injury.  · If it is harder than usual to remember things, write them down.  · If you are easily distracted, try to do one thing at a time. For example, do not try to watch TV while fixing dinner.  · Talk with family members or close friends when making important decisions.  · Keep all follow-up appointments. Repeated evaluation of your symptoms is recommended for your recovery.  · Watch your symptoms and tell others to do the same. Complications sometimes occur after a concussion. Older adults with a brain injury may have a higher risk of serious complications, such as a blood clot on the brain.  · Tell your teachers, school nurse, school counselor, coach, athletic trainer, or work manager about your injury, symptoms, and restrictions. Tell them about what you can or cannot do. They should watch for:  ¨ Increased problems with attention or concentration.  ¨ Increased difficulty remembering or learning new information.  ¨ Increased time needed to complete tasks or assignments.  ¨ Increased irritability or decreased ability to cope with stress.  ¨ Increased symptoms.  · Rest. Rest helps the brain to heal. Make sure you:  ¨ Get plenty of sleep at night. Avoid staying up late at night.  ¨ Keep the same bedtime hours on weekends and weekdays.  ¨ Rest during the day. Take daytime naps or rest breaks when you feel tired.  · Limit activities that require a lot of thought or concentration. These include:  ¨ Doing homework or job-related  work.  ¨ Watching TV.  ¨ Working on the computer.  · Avoid any situation where there is potential for another head injury (football, hockey, soccer, basketball, martial arts, downhill snow sports and horseback riding). Your condition will get worse every time you experience a concussion. You should avoid these activities until you are evaluated by the appropriate follow-up health care providers.  Returning To Your Regular Activities  You will need to return to your normal activities slowly, not all at once. You must give your body and brain enough time for recovery.  · Do not return to sports or other athletic activities until your health care provider tells you it is safe to do so.  · Ask your health care provider when you can drive, ride a bicycle, or operate heavy machinery. Your ability to react may be slower after a   brain injury. Never do these activities if you are dizzy.  · Ask your health care provider about when you can return to work or school.  Preventing Another Concussion  It is very important to avoid another brain injury, especially before you have recovered. In rare cases, another injury can lead to permanent brain damage, brain swelling, or death. The risk of this is greatest during the first 7-10 days after a head injury. Avoid injuries by:  · Wearing a seat belt when riding in a car.  · Drinking alcohol only in moderation.  · Wearing a helmet when biking, skiing, skateboarding, skating, or doing similar activities.  · Avoiding activities that could lead to a second concussion, such as contact or recreational sports, until your health care provider says it is okay.  · Taking safety measures in your home.  ¨ Remove clutter and tripping hazards from floors and stairways.  ¨ Use grab bars in bathrooms and handrails by stairs.  ¨ Place non-slip mats on floors and in bathtubs.  ¨ Improve lighting in dim areas.  SEEK MEDICAL CARE IF:  · You have increased problems paying attention or  concentrating.  · You have increased difficulty remembering or learning new information.  · You need more time to complete tasks or assignments than before.  · You have increased irritability or decreased ability to cope with stress.  · You have more symptoms than before.  Seek medical care if you have any of the following symptoms for more than 2 weeks after your injury:  · Lasting (chronic) headaches.  · Dizziness or balance problems.  · Nausea.  · Vision problems.  · Increased sensitivity to noise or light.  · Depression or mood swings.  · Anxiety or irritability.  · Memory problems.  · Difficulty concentrating or paying attention.  · Sleep problems.  · Feeling tired all the time.  SEEK IMMEDIATE MEDICAL CARE IF:  · You have severe or worsening headaches. These may be a sign of a blood clot in the brain.  · You have weakness (even if only in one hand, leg, or part of the face).  · You have numbness.  · You have decreased coordination.  · You vomit repeatedly.  · You have increased sleepiness.  · One pupil is larger than the other.  · You have convulsions.  · You have slurred speech.  · You have increased confusion. This may be a sign of a blood clot in the brain.  · You have increased restlessness, agitation, or irritability.  · You are unable to recognize people or places.  · You have neck pain.  · It is difficult to wake you up.  · You have unusual behavior changes.  · You lose consciousness.  MAKE SURE YOU:  · Understand these instructions.  · Will watch your condition.  · Will get help right away if you are not doing well or get worse.  Document Released: 07/13/2003 Document Revised: 04/27/2013 Document Reviewed: 11/12/2012  [employer]® Patient Information ©2015 [employer]. This information is not intended to replace advice given to you by your health care provider. Make sure you discuss any questions you have with your health care provider.

## 2015-06-04 IMAGING — CT CT HEAD W/O CM
1 series · 16 of 30 positions shown, 20 images · non-contrast
Comparison: None.

CLINICAL DATA: Headache

EXAM:
CT HEAD WITHOUT CONTRAST
TECHNIQUE: Contiguous axial images were obtained from the base of the skull
through the vertex without intravenous contrast.

[Series 2: head 5.0 h30s · axial · 0.49mm/px · z∈[-215,-60]mm · 16 of 35 slices shown, 20 images]
[im 2/35  brain]
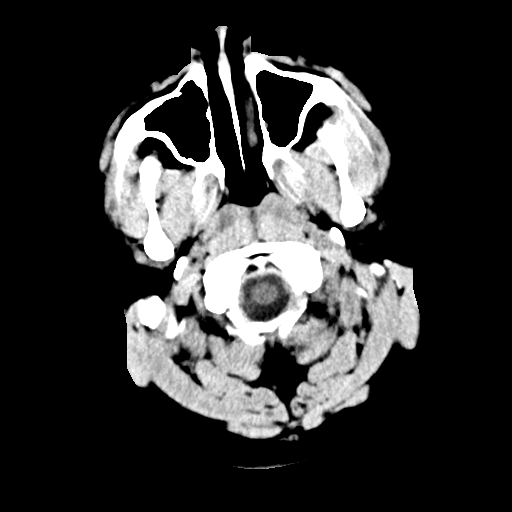
[im 2/35  bone]
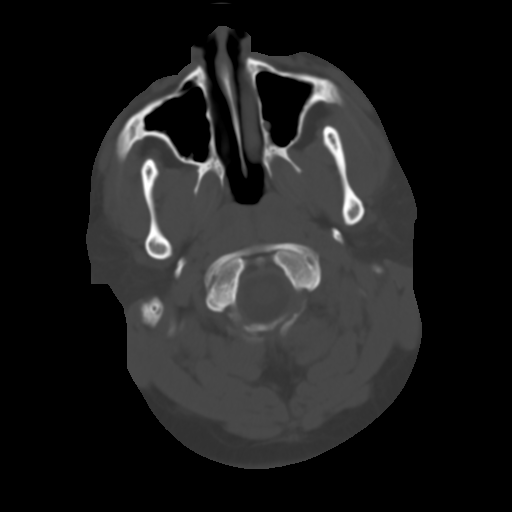
[im 4/35  brain]
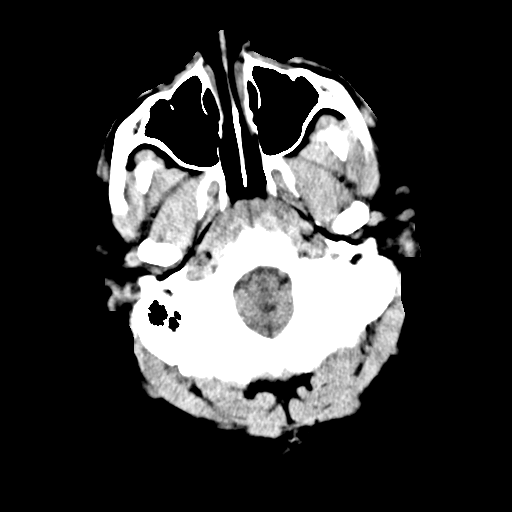
[im 6/35  brain]
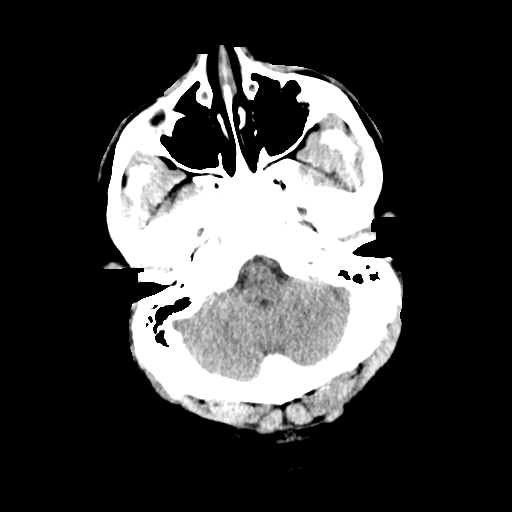
[im 9/35  brain]
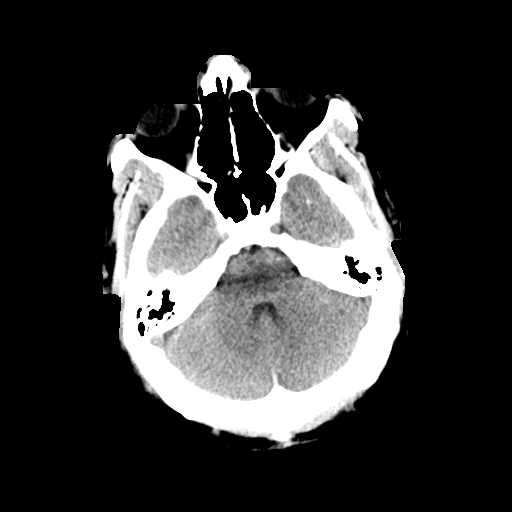
[im 10/35  brain]
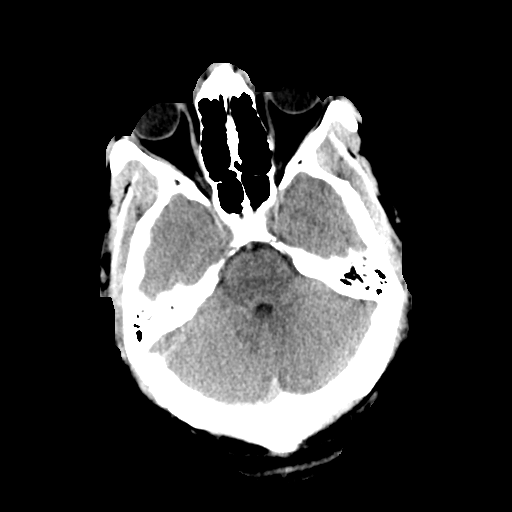
[im 10/35  bone]
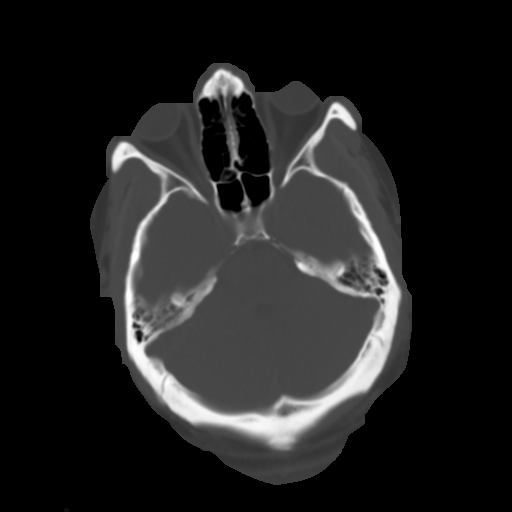
[im 12/35  brain]
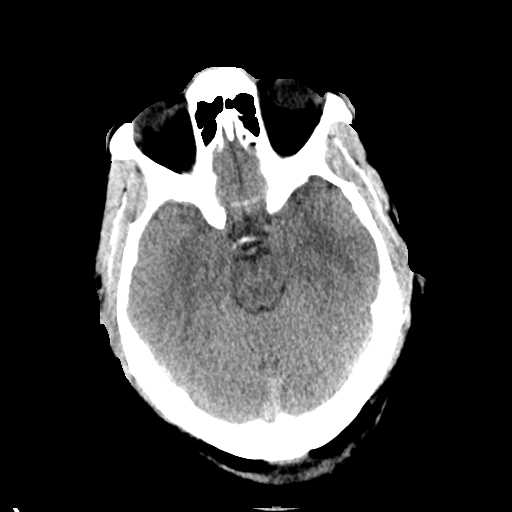
[im 15/35  brain]
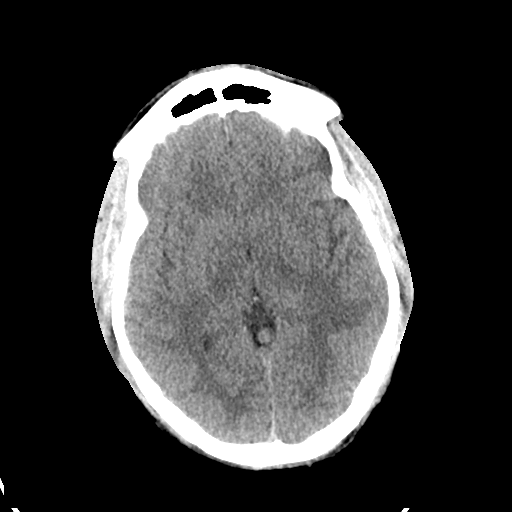
[im 17/35  brain]
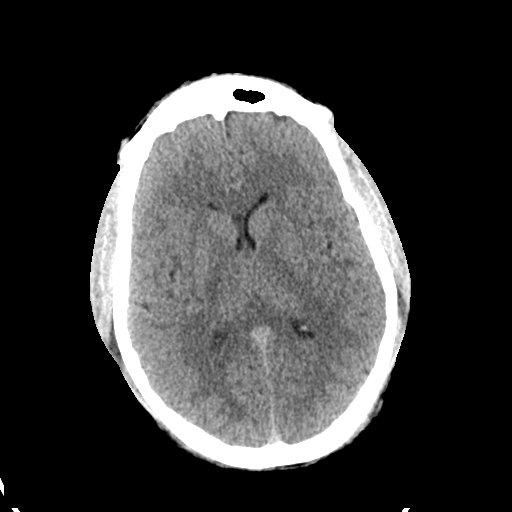
[im 18/35  brain]
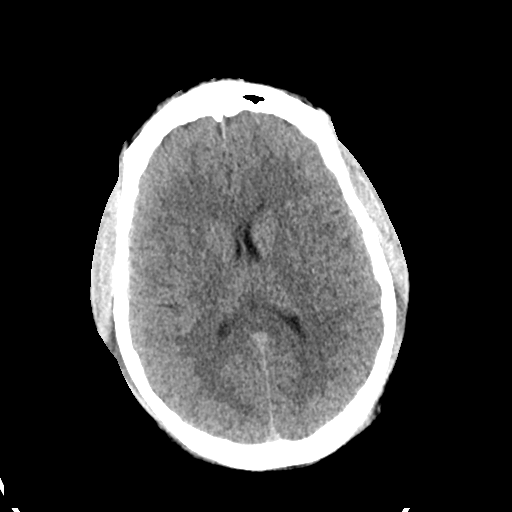
[im 18/35  bone]
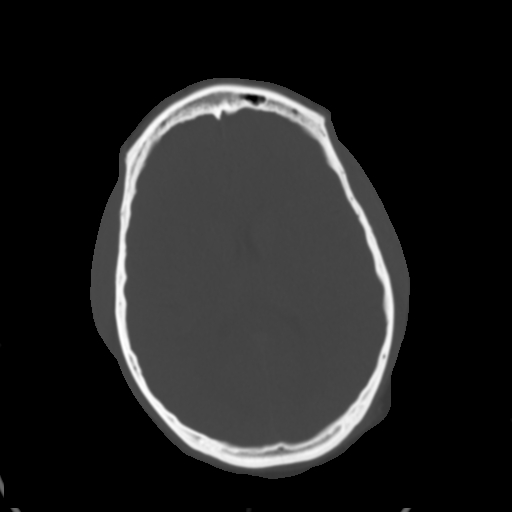
[im 20/35  brain]
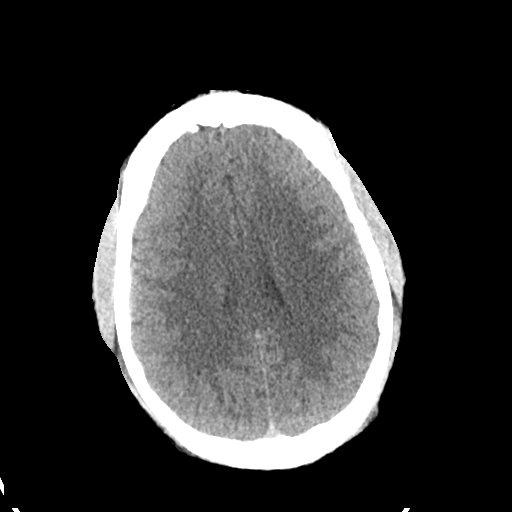
[im 23/35  brain]
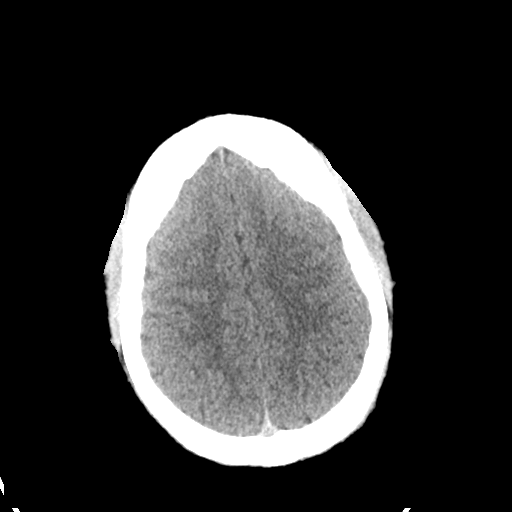
[im 25/35  brain]
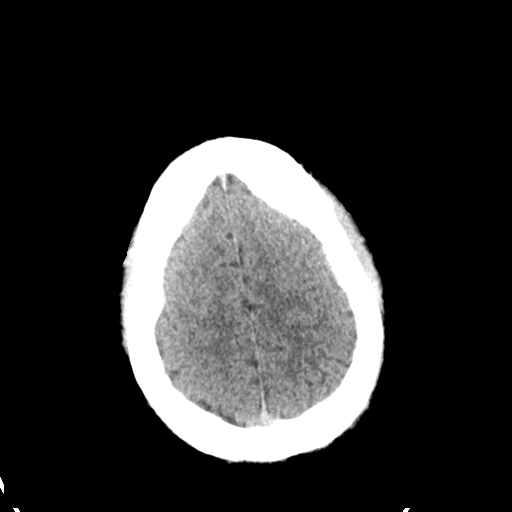
[im 26/35  brain]
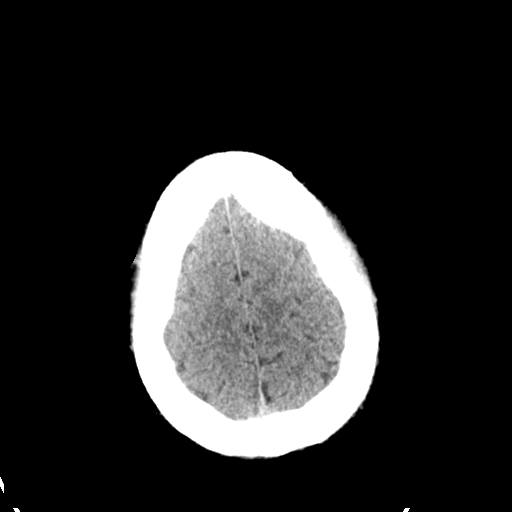
[im 26/35  bone]
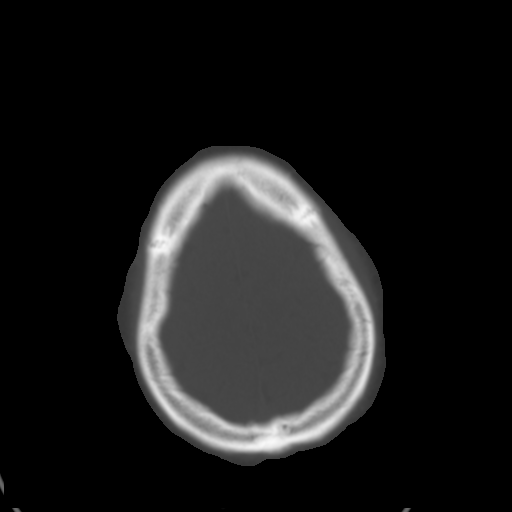
[im 29/35  brain]
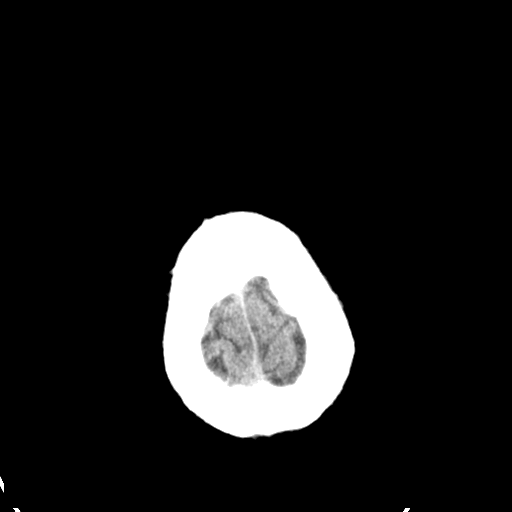
[im 31/35  brain]
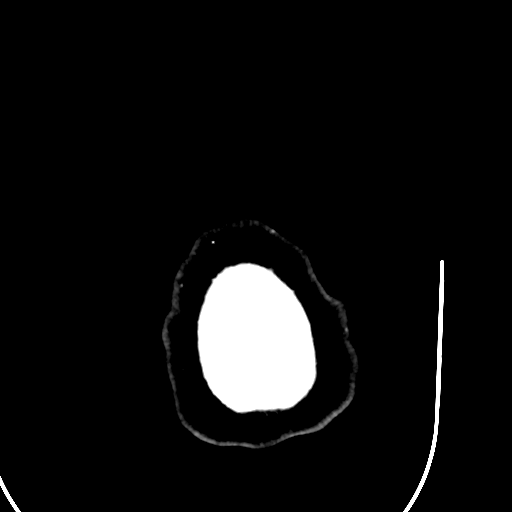
[im 33/35  brain]
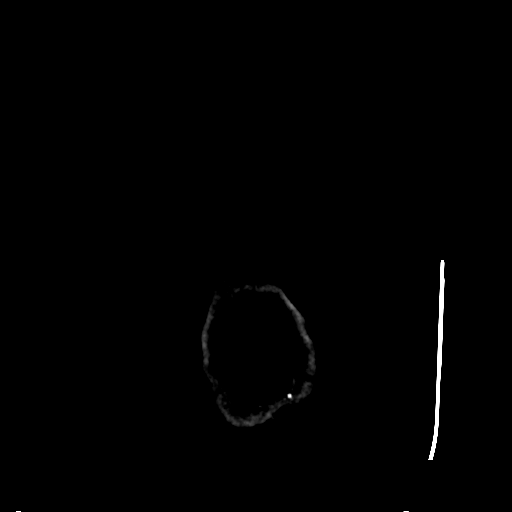

[16 of 30 positions shown; findings below may reference images not displayed]

FINDINGS: Brain parenchyma, ventricular system, and extra-axial space are
within normal limits. No mass effect, midline shift, or acute
hemorrhage. Mastoid air cells clear. Small mucous retention cysts in
the maxillary sinuses.
IMPRESSION: No acute intracranial pathology.

## 2017-01-15 ENCOUNTER — Encounter (HOSPITAL_COMMUNITY): Payer: Self-pay | Admitting: Emergency Medicine

## 2017-01-15 ENCOUNTER — Emergency Department (HOSPITAL_COMMUNITY)
Admission: EM | Admit: 2017-01-15 | Discharge: 2017-01-15 | Disposition: A | Discharge status: Home / Self Care | Payer: 59 | Attending: Emergency Medicine | Admitting: Emergency Medicine

## 2017-01-15 ENCOUNTER — Emergency Department (HOSPITAL_COMMUNITY): Payer: 59

## 2017-01-15 DIAGNOSIS — M79641 Pain in right hand: Secondary | ICD-10-CM | POA: Diagnosis not present

## 2017-01-15 DIAGNOSIS — Z79899 Other long term (current) drug therapy: Secondary | ICD-10-CM | POA: Diagnosis not present

## 2017-01-15 LAB — CBC WITH DIFFERENTIAL/PLATELET
BASOS PCT: 0 %
Basophils Absolute: 0 10*3/uL (ref 0.0–0.1)
Eosinophils Absolute: 0.1 10*3/uL (ref 0.0–0.7)
Eosinophils Relative: 1 %
HCT: 43.7 % (ref 39.0–52.0)
Hemoglobin: 14.5 g/dL (ref 13.0–17.0)
Lymphocytes Relative: 29 %
Lymphs Abs: 3.1 10*3/uL (ref 0.7–4.0)
MCH: 26.1 pg (ref 26.0–34.0)
MCHC: 33.2 g/dL (ref 30.0–36.0)
MCV: 78.6 fL (ref 78.0–100.0)
MONO ABS: 0.5 10*3/uL (ref 0.1–1.0)
MONOS PCT: 5 %
Neutro Abs: 7 10*3/uL (ref 1.7–7.7)
Neutrophils Relative %: 65 %
Platelets: 336 10*3/uL (ref 150–400)
RBC: 5.56 MIL/uL (ref 4.22–5.81)
RDW: 13.7 % (ref 11.5–15.5)
WBC: 10.7 10*3/uL — ABNORMAL HIGH (ref 4.0–10.5)

## 2017-01-15 LAB — COMPREHENSIVE METABOLIC PANEL
ALBUMIN: 3.9 g/dL (ref 3.5–5.0)
ALT: 27 U/L (ref 17–63)
ANION GAP: 9 (ref 5–15)
AST: 27 U/L (ref 15–41)
Alkaline Phosphatase: 55 U/L (ref 38–126)
BUN: 12 mg/dL (ref 6–20)
CO2: 24 mmol/L (ref 22–32)
Calcium: 9.2 mg/dL (ref 8.9–10.3)
Chloride: 106 mmol/L (ref 101–111)
Creatinine, Ser: 1.25 mg/dL — ABNORMAL HIGH (ref 0.61–1.24)
GFR calc Af Amer: >60 mL/min (ref >60)
GFR calc non Af Amer: >60 mL/min (ref >60)
Glucose, Bld: 88 mg/dL (ref 65–99)
POTASSIUM: 3.7 mmol/L (ref 3.5–5.1)
Sodium: 139 mmol/L (ref 135–145)
Total Bilirubin: 1.2 mg/dL (ref 0.3–1.2)
Total Protein: 7 g/dL (ref 6.5–8.1)

## 2017-01-15 MED ORDER — OXYCODONE-ACETAMINOPHEN 5-325 MG PO TABS
ORAL_TABLET | ORAL | Status: AC
  Filled 2017-01-15: qty 1

## 2017-01-15 MED ORDER — DEXAMETHASONE SODIUM PHOSPHATE 4 MG/ML IJ SOLN
8.0000 mg | Freq: Once | INTRAMUSCULAR | Status: AC
  Administered 2017-01-15: 8 mg via INTRAMUSCULAR
  Filled 2017-01-15: qty 2

## 2017-01-15 MED ORDER — OXYCODONE-ACETAMINOPHEN 5-325 MG PO TABS
1.0000 | ORAL_TABLET | Freq: Once | ORAL | Status: AC
  Administered 2017-01-15: 1 via ORAL

## 2017-01-15 NOTE — ED Triage Notes (Signed)
Pt reports R hand cramping since 2200 yesterday. States some heavy lifting, but denies injury/trauma. Difficulty moving extremity. Goody powder not effective for pain.

## 2017-01-15 NOTE — Discharge Instructions (Signed)
X-ray of hand was normal.  Your creatinine Dustin Chapman[which is a measure of kidney function] is slightly elevated at 1.25. Otherwise, your blood tests were normal.Tylenol or ibuprofen for pain.

## 2017-01-15 NOTE — ED Provider Notes (Signed)
[facility]-EMERGENCY DEPT Provider Note   CSN: [id number] Arrival date & time: 01/15/17  0211     History   Chief Complaint Chief Complaint  Patient presents with  . Hand Pain    HPI Dustin Chapman is a 26 y.o. male.  Patient reports cramping of right hand abruptly at work last night at approximately 2200.  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 No obvious injury, trauma, bite, fever, sweats, chills, neurological deficits. Severity of cramping is moderate. Nothing makes symptoms better or worse.      History reviewed. No pertinent past medical history.  Patient Active Problem List   Diagnosis Date Noted  . MDD (major depressive disorder), recurrent episode, severe (HCC) 05/04/2013  . Adjustment disorder with mixed anxiety and depressed mood 05/04/2013  . MDD (major depressive disorder) 05/04/2013    History reviewed. No pertinent surgical history.     Home Medications    Prior to Admission medications   Medication Sig Start Date End Date Taking? Authorizing Provider  acetaminophen (TYLENOL) 500 MG tablet Take 500 mg by mouth every 6 (six) hours as needed (headache).    [provider]  FLUoxetine (PROZAC) 20 MG capsule Take 20 mg by mouth daily.    [provider]  hydrOXYzine (ATARAX/VISTARIL) 25 MG tablet Take 25 mg by mouth every 6 (six) hours as needed for anxiety.    [provider]  ondansetron (ZOFRAN) 4 MG tablet Take 1 tablet (4 mg total) by mouth every 8 (eight) hours as needed for nausea or vomiting. 12/19/13   Horton, Mayer Masker, MD  oxyCODONE-acetaminophen (PERCOCET/ROXICET) 5-325 MG per tablet Take 1-2 tablets by mouth every 6 (six) hours as needed for moderate pain or severe pain. 12/19/13   Horton, Mayer Masker, MD  traZODone (DESYREL) 50 MG tablet Take 50 mg by mouth at bedtime.    [provider]    Family History No family history on file.  Social History Social History  Substance Use Topics  . Smoking  status: Never Smoker  . Smokeless tobacco: Never Used  . Alcohol use Yes     Comment: ocasionaly     Allergies   Patient has no known allergies.   Review of Systems Review of Systems  All other systems reviewed and are negative.    Physical Exam Updated Vital Signs BP 138/79   Pulse (!) 54   Temp 98.8 F (37.1 C) (Oral)   Resp 17   Ht  (1.778 m)   Wt (!) 145.2 kg (320 lb)   SpO2 98%   BMI 45.92 kg/m   Physical Exam  Constitutional: He is oriented to person, place, and time. He appears well-developed and well-nourished.  HENT:  Head: Normocephalic and atraumatic.  Eyes: Conjunctivae are normal.  Neck: Neck supple.  Cardiovascular: Normal rate and regular rhythm.   Pulmonary/Chest: Effort normal and breath sounds normal.  Abdominal: Soft. Bowel sounds are normal.  Musculoskeletal:  Right lower extremity: Hand is in a flexion mode. Some tenderness in the dorsum of the hand. No obvious redness or swelling  Neurological: He is alert and oriented to person, place, and time.  Skin: Skin is warm and dry.  Psychiatric: He has a normal mood and affect. His behavior is normal.  Nursing note and vitals reviewed.    ED Treatments / Results  Labs (all labs ordered are listed, but only abnormal results are displayed) Labs Reviewed  CBC WITH DIFFERENTIAL/PLATELET - Abnormal; Notable for the following:  Result Value   WBC 10.7 (*)    All other components within normal limits  COMPREHENSIVE METABOLIC PANEL - Abnormal; Notable for the following:    Creatinine, Ser 1.25 (*)    All other components within normal limits    EKG  EKG Interpretation None       Radiology Dg Hand 2 View Right  Result Date: 01/15/2017 CLINICAL DATA:  26 y/o  M; right hand pain. EXAM: RIGHT HAND - 2 VIEW COMPARISON:  None. FINDINGS: There is no evidence of fracture or dislocation. There is no evidence of arthropathy or other focal bone abnormality. Soft tissues are unremarkable.  IMPRESSION: Negative. Electronically Signed   By: Mitzi HansenLance  Furusawa-Stratton M.D.   On: 01/15/2017 02:51    Procedures Procedures (including critical care time)  Medications Ordered in ED Medications  oxyCODONE-acetaminophen (PERCOCET/ROXICET) 5-325 MG per tablet (not administered)  oxyCODONE-acetaminophen (PERCOCET/ROXICET) 5-325 MG per tablet 1 tablet (1 tablet Oral Given 01/15/17 0223)  dexamethasone (DECADRON) injection 8 mg (8 mg Intramuscular Given 01/15/17 1121)     Initial Impression / Assessment and Plan / ED Course  I have reviewed the triage vital signs and the nursing notes.  Pertinent labs & imaging results that were available during my care of the patient were reviewed by me and considered in my medical decision making (see chart for details).     Patient is in no acute distress. Metabolic profile acceptable. Creatinine mildly elevated. Symptoms have reduced considerably sense initial evaluation. Decadron 8 mg IM given for inflammation  Final Clinical Impressions(s) / ED Diagnoses   Final diagnoses:  Right hand pain    New Prescriptions Discharge Medication List as of 01/15/2017 12:03 PM       Donnetta Hutchingook, Charma Mocarski, MD 01/15/17 1229

## 2017-01-15 NOTE — ED Triage Notes (Signed)
Pt given narcotic pain medicine in triage. Advised of side effects and instructed to avoid driving for a minimum of four hours.  

## 2018-07-01 IMAGING — CR DG HAND 2V*R*
2 series · 2 of 2 positions shown · non-contrast
Comparison: None.

CLINICAL DATA: 25 y/o  M; right hand pain.

EXAM:
RIGHT HAND - 2 VIEW

[hand pa]
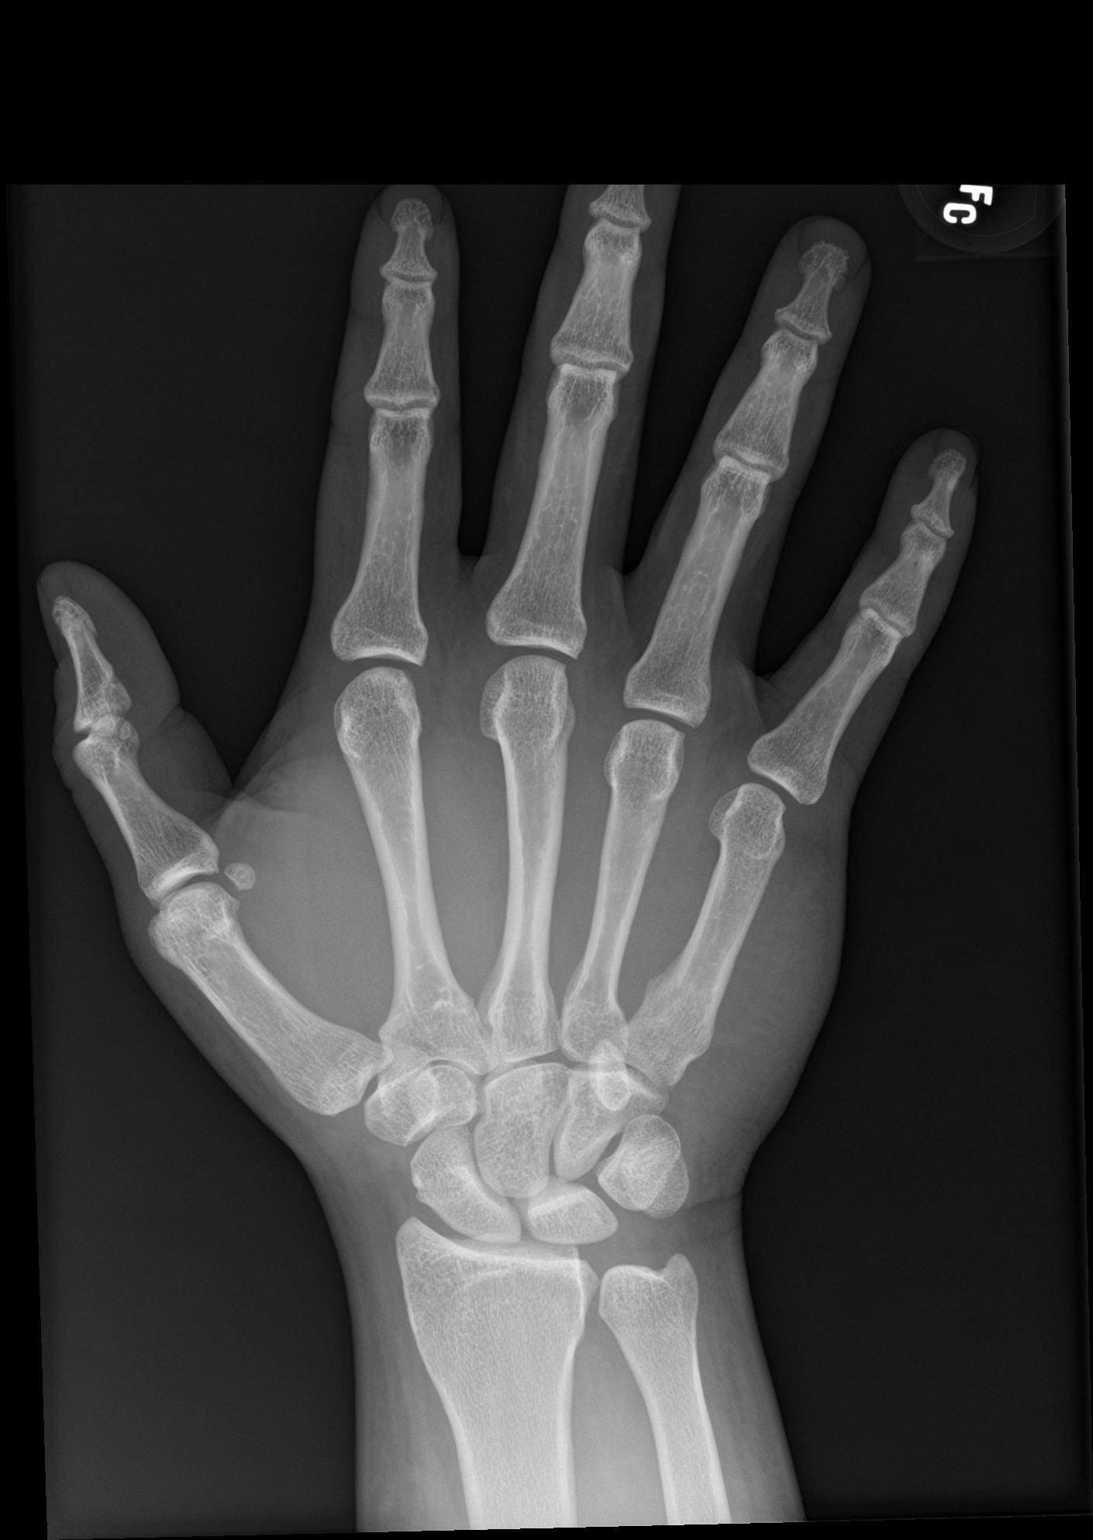

[hand lat]
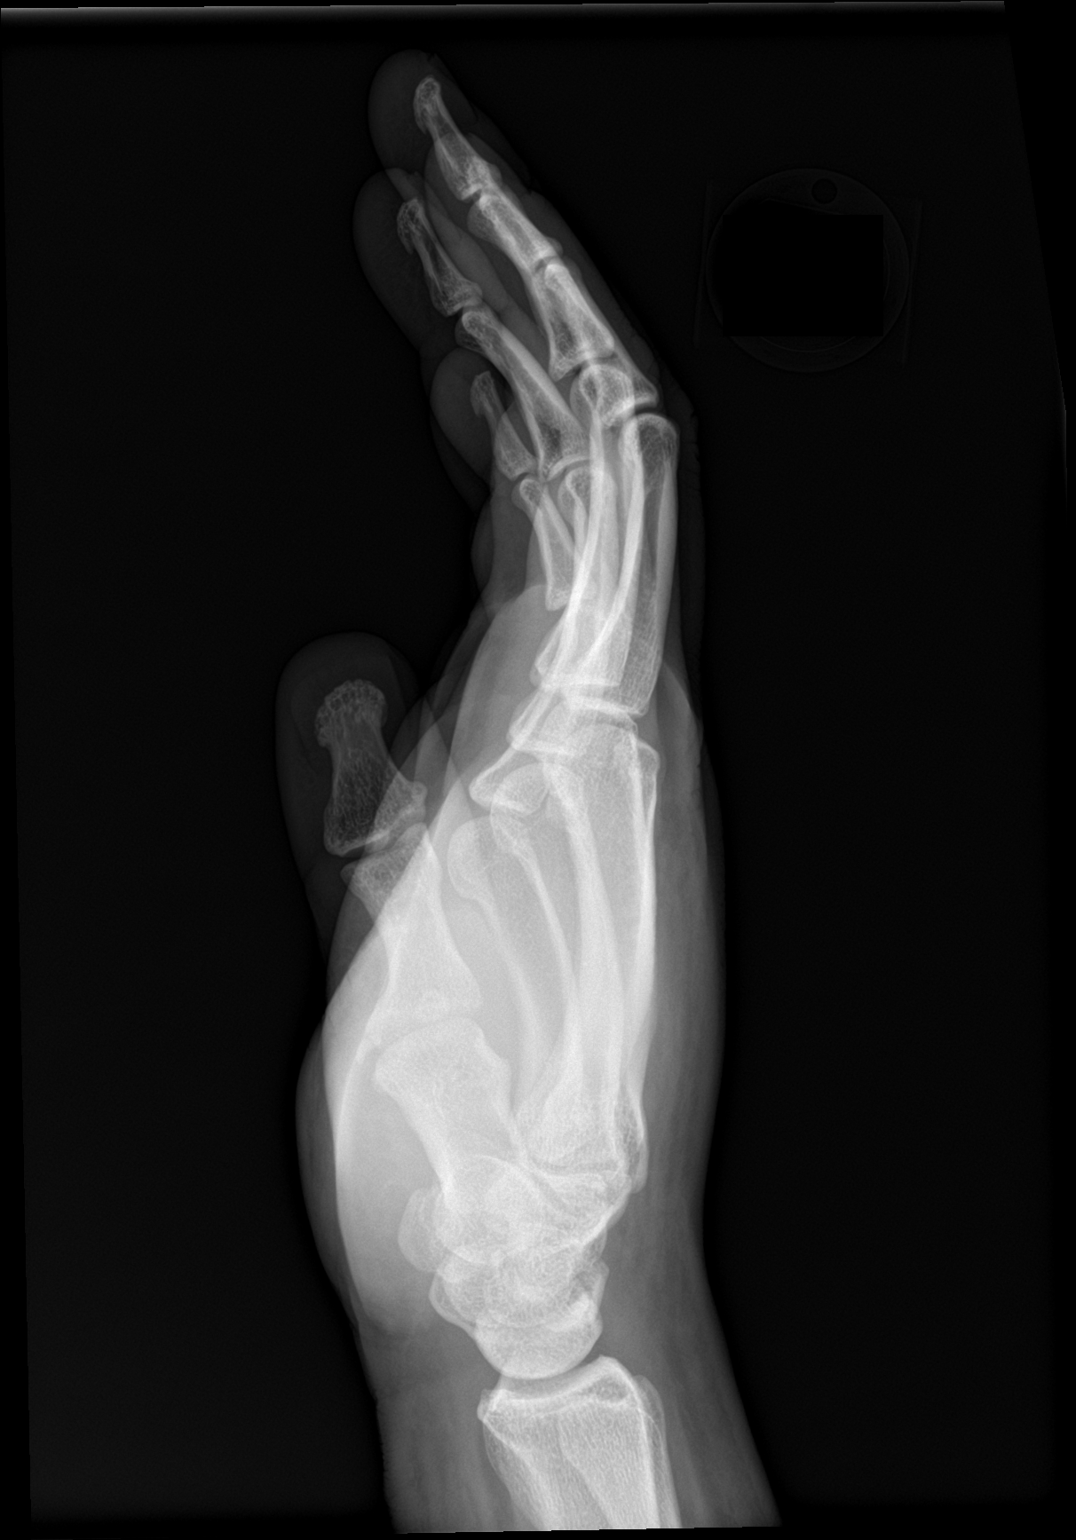

[2 of 2 positions shown; findings below may reference images not displayed]

FINDINGS: There is no evidence of fracture or dislocation. There is no
evidence of arthropathy or other focal bone abnormality. Soft
tissues are unremarkable.
IMPRESSION: Negative.

By: Om Youssef Ramiz M.D.
